# Patient Record
Sex: Female | Born: 1965 | Race: White | Hispanic: No | Marital: Married | Smoking: Current every day smoker
Health system: Southern US, Community
[De-identification: ages and names within clinical notes are randomized; demographics above are authoritative.]

## PROBLEM LIST (undated history)

## (undated) DIAGNOSIS — G8929 Other chronic pain: Secondary | ICD-10-CM

## (undated) DIAGNOSIS — G35 Multiple sclerosis: Secondary | ICD-10-CM

## (undated) DIAGNOSIS — F419 Anxiety disorder, unspecified: Secondary | ICD-10-CM

## (undated) HISTORY — DX: Anxiety disorder, unspecified: F41.9

## (undated) HISTORY — DX: Other chronic pain: G89.29

## (undated) HISTORY — DX: Multiple sclerosis: G35

## (undated) HISTORY — PX: THYROIDECTOMY: SHX17

---

## 2020-12-27 ENCOUNTER — Ambulatory Visit: Payer: Self-pay | Admitting: Internal Medicine

## 2021-01-11 ENCOUNTER — Ambulatory Visit: Payer: Medicaid Other | Admitting: Internal Medicine

## 2021-02-08 ENCOUNTER — Other Ambulatory Visit: Payer: Self-pay

## 2021-02-08 ENCOUNTER — Encounter: Payer: Self-pay | Admitting: Internal Medicine

## 2021-02-08 ENCOUNTER — Ambulatory Visit (INDEPENDENT_AMBULATORY_CARE_PROVIDER_SITE_OTHER): Payer: Medicare Other | Admitting: Internal Medicine

## 2021-02-08 VITALS — BP 128/82 | HR 102 | Resp 18 | Ht 67.0 in | Wt 177.1 lb

## 2021-02-08 DIAGNOSIS — Z1159 Encounter for screening for other viral diseases: Secondary | ICD-10-CM

## 2021-02-08 DIAGNOSIS — Z7689 Persons encountering health services in other specified circumstances: Secondary | ICD-10-CM | POA: Diagnosis not present

## 2021-02-08 DIAGNOSIS — Z1231 Encounter for screening mammogram for malignant neoplasm of breast: Secondary | ICD-10-CM

## 2021-02-08 DIAGNOSIS — G894 Chronic pain syndrome: Secondary | ICD-10-CM | POA: Insufficient documentation

## 2021-02-08 DIAGNOSIS — Z124 Encounter for screening for malignant neoplasm of cervix: Secondary | ICD-10-CM

## 2021-02-08 DIAGNOSIS — E890 Postprocedural hypothyroidism: Secondary | ICD-10-CM | POA: Diagnosis not present

## 2021-02-08 DIAGNOSIS — G35 Multiple sclerosis: Secondary | ICD-10-CM | POA: Diagnosis not present

## 2021-02-08 DIAGNOSIS — Z114 Encounter for screening for human immunodeficiency virus [HIV]: Secondary | ICD-10-CM

## 2021-02-08 DIAGNOSIS — G4700 Insomnia, unspecified: Secondary | ICD-10-CM

## 2021-02-08 MED ORDER — HYDROXYZINE PAMOATE 25 MG PO CAPS: 25.0000 mg | ORAL_CAPSULE | Freq: Three times a day (TID) | ORAL | 0 refills | Status: AC | PRN

## 2021-02-08 NOTE — Patient Instructions (Signed)
You are being referred to Dr Gerilyn Pilgrim for MS and chronic pain.  Please get fasting blood tests done within a week.

## 2021-02-09 ENCOUNTER — Encounter: Payer: Self-pay | Admitting: Internal Medicine

## 2021-02-09 NOTE — Progress Notes (Signed)
New Patient Office Visit  Subjective:  Patient ID: Lindsay Warren, female    DOB: 1966-01-03  Age: 55 y.o. MRN: [id number]  CC:  Chief Complaint  Patient presents with  . New Patient (Initial Visit)    New patient has MS and chronic pain back and legs are giving her a fit also tingling in legs is terrible also pt cant sleep she needs something to help her sleep pt also has ringing in her ears has been going on for about 4 months is constant     HPI Lindsay Warren is a 55 year old female with PMH of progressive MS, hypothyroidism, anxiety, insomnia and chronic pain syndrome who presents for establishing care. She recently moved from [place].  She has h/o MS, for which she used to see a Garment/textile technologist. She was on [place], but ran out of it recently. She has been having worsening of her weakness and pain as she also ran out her pain medications. She still takes Gabapentin and Lyrica (?). She was on Oxycodone 30 mg TID for pain as well.  She has h/o postoperative hypothyroidism, for which she was taking Levothyroxine, prescribed by her Neurologist (?). She does not know her dose of Levothyroxine. She denies any palpitations, tremors, recent change in appetite or weight.  She reports worsening of her anxiety and insomnia, as she has not had her Valium for more than 6 months. She denies any SI or HI.  She has had colonoscopy in the past, but does not recall any details.   Past Medical History:  Diagnosis Date  . Anxiety   . Chronic pain   . Multiple sclerosis ([place])     Past Surgical History:  Procedure Laterality Date  . THYROIDECTOMY      History reviewed. No pertinent family history.  Social History   Socioeconomic History  . Marital status: Married    Spouse name: Not on file  . Number of children: Not on file  . Years of education: Not on file  . Highest education level: Not on file  Occupational History  . Not on file  Tobacco Use  . Smoking status: Current  Every Day Smoker    Packs/day: 1.00    Types: Cigarettes  . Smokeless tobacco: Never Used  Substance and Sexual Activity  . Alcohol use: Never  . Drug use: Never  . Sexual activity: Not on file  Other Topics Concern  . Not on file  Social History Narrative  . Not on file   Social Determinants of Health   Financial Resource Strain: Not on file  Food Insecurity: Not on file  Transportation Needs: Not on file  Physical Activity: Not on file  Stress: Not on file  Social Connections: Not on file  Intimate Partner Violence: Not on file    ROS Review of Systems  Constitutional: Positive for fatigue. Negative for chills and fever.  HENT: Negative for congestion, sinus pressure, sinus pain and sore throat.   Eyes: Negative for pain and discharge.  Respiratory: Negative for cough and shortness of breath.   Cardiovascular: Negative for chest pain and palpitations.  Gastrointestinal: Negative for abdominal pain, constipation, diarrhea, nausea and vomiting.  Endocrine: Negative for polydipsia and polyuria.  Genitourinary: Negative for dysuria and hematuria.  Musculoskeletal: Positive for arthralgias, back pain, gait problem and neck pain. Negative for neck stiffness.  Skin: Negative for rash.  Neurological: Positive for weakness and numbness. Negative for dizziness and seizures.  Psychiatric/Behavioral: Positive for sleep disturbance. Negative  for agitation and behavioral problems. The patient is nervous/anxious.     Objective:   Today's Vitals: BP 128/82 (BP Location: Left Arm, Patient Position: Sitting, Cuff Size: Normal)   Pulse (!) 102   Resp 18   Ht 5' 7"  (1.702 m)   Wt 177 lb 1.9 oz (80.3 kg)   SpO2 98%   BMI 27.74 kg/m   Physical Exam Vitals reviewed.  Constitutional:      General: She is not in acute distress.    Appearance: She is not diaphoretic.  HENT:     Head: Normocephalic and atraumatic.     Nose: Nose normal.     Mouth/Throat:     Mouth: Mucous membranes  are moist.  Eyes:     General: No scleral icterus.    Extraocular Movements: Extraocular movements intact.  Cardiovascular:     Rate and Rhythm: Normal rate and regular rhythm.     Pulses: Normal pulses.     Heart sounds: Normal heart sounds. No murmur heard.   Pulmonary:     Breath sounds: Normal breath sounds. No wheezing or rales.  Abdominal:     Palpations: Abdomen is soft.     Tenderness: There is no abdominal tenderness.  Musculoskeletal:     Cervical back: Neck supple. No tenderness.     Right lower leg: No edema.     Left lower leg: No edema.  Skin:    General: Skin is warm.     Findings: No rash.  Neurological:     Mental Status: She is alert and oriented to person, place, and time. Mental status is at baseline.     Sensory: No sensory deficit.  Psychiatric:        Mood and Affect: Mood normal.        Behavior: Behavior normal.     Assessment & Plan:   Problem List Items Addressed This Visit      Encounter to establish care - Primary   Care established Previous chart reviewed History and medications reviewed with the patient     Relevant Orders  CBC with Differential  CMP14+EGFR  Lipid panel    Endocrine   Postoperative hypothyroidism    Has run out of Levothyroxine, does not recall dose Will request records from previous PCP Check TSH and free T4      Relevant Orders   TSH + free T4     Nervous and Auditory   Multiple sclerosis ([place])    Was on [place] in [place] Referred to Neurology for further management      Relevant Orders   Ambulatory referral to Neurology     Other      Insomnia    Was on Valium in the past, has not had it for more than 6 months Started Vistaril 25 mg TID PRN for anxiety and insomnia      Relevant Medications   hydrOXYzine (VISTARIL) 25 MG capsule   Chronic pain syndrome    On Lyrica 150 mg BID and Gabapentin 800 mg TID Was on Oxycodone 30 mg TID, has run out of it. Referred to Neurology for urgent evaluation  for MS and chronic pain syndrome      Relevant Medications   gabapentin (NEURONTIN) 800 MG tablet   pregabalin (LYRICA) 150 MG capsule   Other Relevant Orders   Ambulatory referral to Neurology    Other Visit Diagnoses    Screening mammogram for breast cancer       Relevant Orders  MM 3D SCREEN BREAST BILATERAL   Routine cervical smear       Relevant Orders   Ambulatory referral to Obstetrics / Gynecology   Need for hepatitis C screening test       Relevant Orders   Hepatitis C Antibody   Encounter for screening for HIV       Relevant Orders   HIV antibody (with reflex)      Outpatient Encounter Medications as of 02/08/2021  Medication Sig  . gabapentin (NEURONTIN) 800 MG tablet Take 800 mg by mouth 3 (three) times daily.  . hydrOXYzine (VISTARIL) 25 MG capsule Take 1 capsule (25 mg total) by mouth every 8 (eight) hours as needed.  . pregabalin (LYRICA) 150 MG capsule Take 150 mg by mouth 2 (two) times daily.   No facility-administered encounter medications on file as of 02/08/2021.    Follow-up: Return in about 3 months (around 05/11/2021) for Hypothyroidism.   Lindell Spar, MD

## 2021-02-09 NOTE — Assessment & Plan Note (Signed)
Care established Previous chart reviewed History and medications reviewed with the patient 

## 2021-02-09 NOTE — Assessment & Plan Note (Signed)
Was on Kesim[place] in [facility] Referred to Neurology for further management

## 2021-02-09 NOTE — Assessment & Plan Note (Signed)
Was on Valium in the past, has not had it for more than 6 months Started Vistaril 25 mg TID PRN for anxiety and insomnia

## 2021-02-09 NOTE — Assessment & Plan Note (Signed)
On Lyrica 150 mg BID and Gabapentin 800 mg TID Was on Oxycodone 30 mg TID, has run out of it. Referred to Neurology for urgent evaluation for MS and chronic pain syndrome

## 2021-02-09 NOTE — Assessment & Plan Note (Signed)
Has run out of Levothyroxine, does not recall dose Will request records from previous PCP Check TSH and free T4

## 2021-02-10 ENCOUNTER — Other Ambulatory Visit: Payer: Self-pay | Admitting: Internal Medicine

## 2021-02-10 DIAGNOSIS — E782 Mixed hyperlipidemia: Secondary | ICD-10-CM

## 2021-02-10 DIAGNOSIS — E890 Postprocedural hypothyroidism: Secondary | ICD-10-CM

## 2021-02-10 MED ORDER — LEVOTHYROXINE SODIUM 100 MCG PO TABS
100.0000 ug | ORAL_TABLET | Freq: Every day | ORAL | 0 refills | Status: DC
Start: 2021-02-10 — End: 2021-05-09

## 2021-02-10 MED ORDER — ROSUVASTATIN CALCIUM 10 MG PO TABS: 10.0000 mg | ORAL_TABLET | Freq: Every day | ORAL | 1 refills | Status: AC

## 2021-02-11 LAB — CMP14+EGFR
ALT: 21 IU/L (ref 0–32)
AST: 26 IU/L (ref 0–40)
Albumin/Globulin Ratio: 1.8 (ref 1.2–2.2)
Albumin: 5 g/dL — ABNORMAL HIGH (ref 3.8–4.9)
Alkaline Phosphatase: 73 IU/L (ref 44–121)
BUN/Creatinine Ratio: 14 (ref 9–23)
BUN: 15 mg/dL (ref 6–24)
Bilirubin Total: 0.3 mg/dL (ref 0.0–1.2)
CO2: 22 mmol/L (ref 20–29)
Calcium: 9.7 mg/dL (ref 8.7–10.2)
Chloride: 99 mmol/L (ref 96–106)
Creatinine, Ser: 1.11 mg/dL — ABNORMAL HIGH (ref 0.57–1.00)
Globulin, Total: 2.8 g/dL (ref 1.5–4.5)
Glucose: 92 mg/dL (ref 65–99)
Potassium: 4.7 mmol/L (ref 3.5–5.2)
Sodium: 139 mmol/L (ref 134–144)
Total Protein: 7.8 g/dL (ref 6.0–8.5)
eGFR: 59 mL/min/{1.73_m2} — ABNORMAL LOW (ref >59)

## 2021-02-11 LAB — CBC WITH DIFFERENTIAL/PLATELET
Basophils Absolute: 0.1 10*3/uL (ref 0.0–0.2)
Basos: 1 %
EOS (ABSOLUTE): 0.2 10*3/uL (ref 0.0–0.4)
Eos: 2 %
Hematocrit: 43.7 % (ref 34.0–46.6)
Hemoglobin: 14.1 g/dL (ref 11.1–15.9)
Immature Grans (Abs): 0 10*3/uL (ref 0.0–0.1)
Immature Granulocytes: 0 %
Lymphocytes Absolute: 2.8 10*3/uL (ref 0.7–3.1)
Lymphs: 31 %
MCH: 30.7 pg (ref 26.6–33.0)
MCHC: 32.3 g/dL (ref 31.5–35.7)
MCV: 95 fL (ref 79–97)
Monocytes Absolute: 0.6 10*3/uL (ref 0.1–0.9)
Monocytes: 6 %
Neutrophils Absolute: 5.6 10*3/uL (ref 1.4–7.0)
Neutrophils: 60 %
Platelets: 250 10*3/uL (ref 150–450)
RBC: 4.59 x10E6/uL (ref 3.77–5.28)
RDW: 14.7 % (ref 11.7–15.4)
WBC: 9.3 10*3/uL (ref 3.4–10.8)

## 2021-02-11 LAB — LIPID PANEL
Chol/HDL Ratio: 16.3 ratio — ABNORMAL HIGH (ref 0.0–4.4)
Cholesterol, Total: 488 mg/dL — ABNORMAL HIGH (ref 100–199)
HDL: 30 mg/dL — ABNORMAL LOW (ref >39)
LDL Chol Calc (NIH): 349 mg/dL — ABNORMAL HIGH (ref 0–99)
Triglycerides: 411 mg/dL — ABNORMAL HIGH (ref 0–149)
VLDL Cholesterol Cal: 109 mg/dL — ABNORMAL HIGH (ref 5–40)

## 2021-02-11 LAB — HEPATITIS C ANTIBODY: Hep C Virus Ab: <0.1 s/co ratio (ref 0.0–0.9)

## 2021-02-11 LAB — HIV ANTIBODY (ROUTINE TESTING W REFLEX): HIV Screen 4th Generation wRfx: NONREACTIVE

## 2021-02-11 LAB — TSH+FREE T4
Free T4: 0.1 ng/dL — ABNORMAL LOW (ref 0.82–1.77)
TSH: 166 u[IU]/mL — ABNORMAL HIGH (ref 0.450–4.500)

## 2021-02-14 ENCOUNTER — Encounter (HOSPITAL_COMMUNITY): Payer: Self-pay | Admitting: Radiology

## 2021-02-14 ENCOUNTER — Ambulatory Visit (HOSPITAL_COMMUNITY)
Admission: RE | Admit: 2021-02-14 | Discharge: 2021-02-14 | Disposition: A | Discharge status: Home / Self Care | Payer: Medicare Other | Source: Ambulatory Visit | Attending: Internal Medicine | Admitting: Internal Medicine

## 2021-02-14 DIAGNOSIS — Z1231 Encounter for screening mammogram for malignant neoplasm of breast: Secondary | ICD-10-CM | POA: Insufficient documentation

## 2021-02-24 ENCOUNTER — Encounter: Payer: Medicare Other | Admitting: Obstetrics & Gynecology

## 2021-04-15 ENCOUNTER — Encounter: Payer: Medicare Other | Admitting: Obstetrics & Gynecology

## 2021-05-08 ENCOUNTER — Other Ambulatory Visit: Payer: Self-pay | Admitting: Internal Medicine

## 2021-05-08 DIAGNOSIS — E890 Postprocedural hypothyroidism: Secondary | ICD-10-CM

## 2021-05-11 ENCOUNTER — Ambulatory Visit: Payer: Medicare Other | Admitting: Internal Medicine

## 2022-03-11 ENCOUNTER — Other Ambulatory Visit: Payer: Self-pay | Admitting: Internal Medicine

## 2022-03-11 DIAGNOSIS — E782 Mixed hyperlipidemia: Secondary | ICD-10-CM

## 2022-05-13 IMAGING — MG MM DIGITAL SCREENING BILAT W/ TOMO AND CAD
8 series · 9 of 24 positions shown · non-contrast
Comparison: None.

CLINICAL DATA: Screening. Baseline.

EXAM:
DIGITAL SCREENING BILATERAL MAMMOGRAM WITH TOMOSYNTHESIS AND CAD
TECHNIQUE: Bilateral screening digital craniocaudal and mediolateral oblique
mammograms were obtained. Bilateral screening digital breast
tomosynthesis was performed. The images were evaluated with
computer-aided detection.

[R MLO synth-2D]
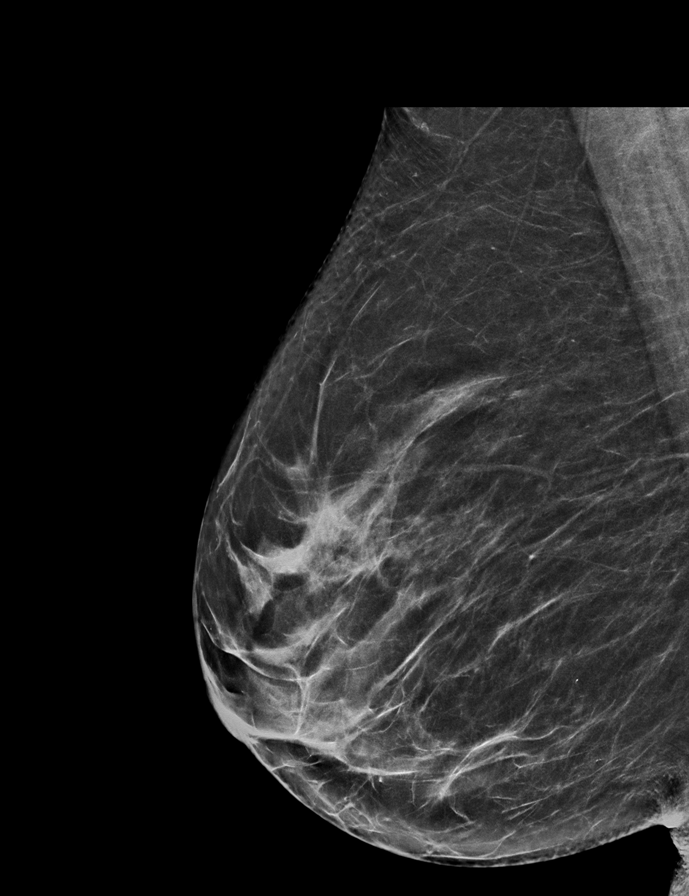

[R CC synth-2D]
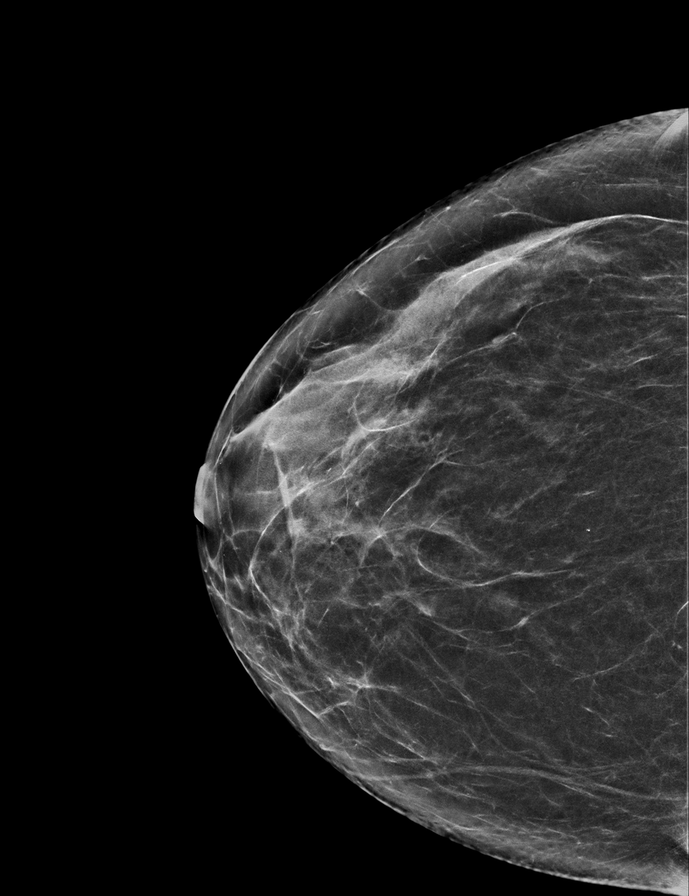

[L MLO synth-2D]
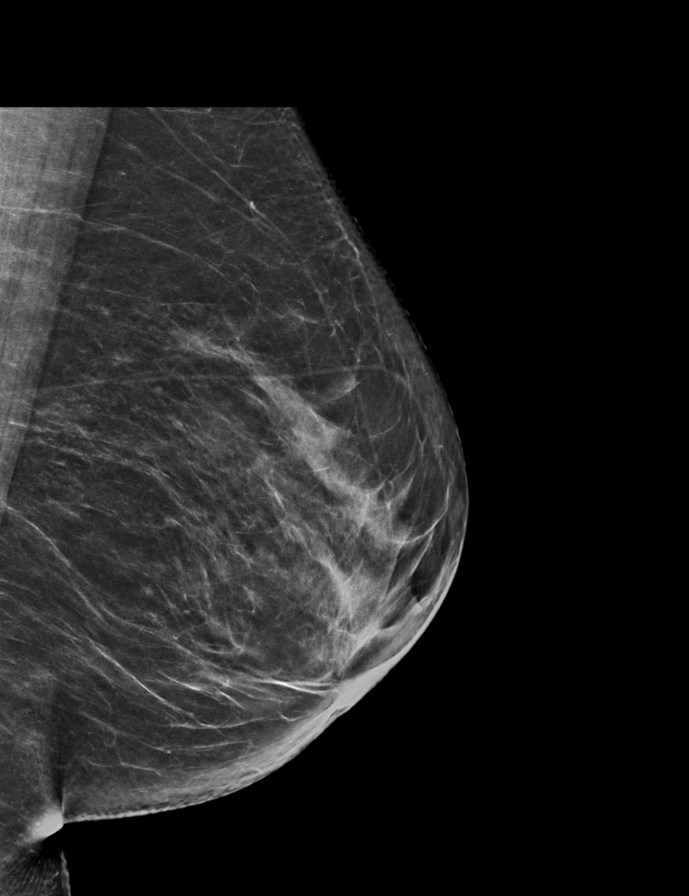

[L CC synth-2D]
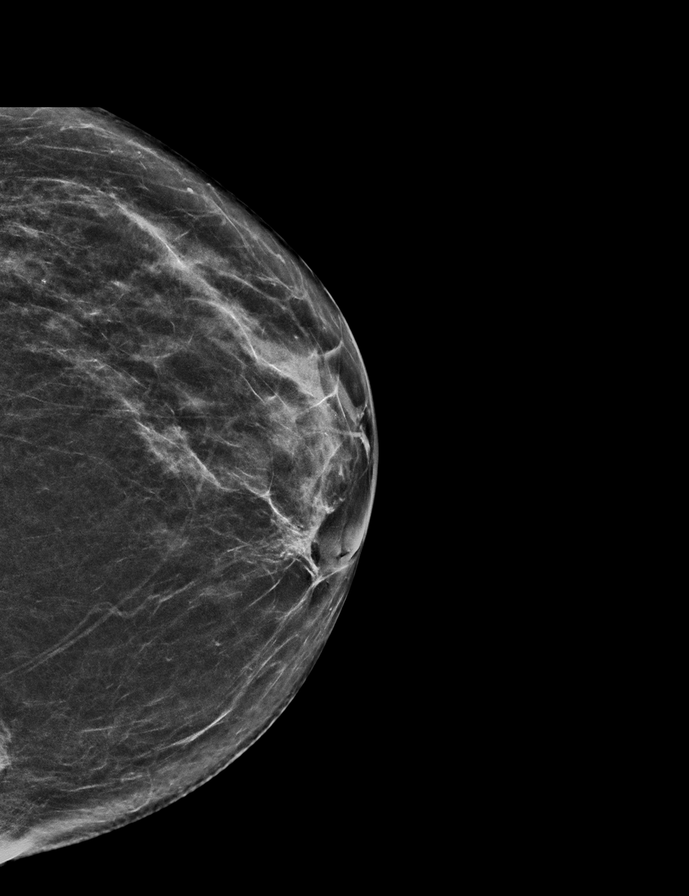

[R MLO tomo · 2 of 67 frames shown]
[frame 22/67]
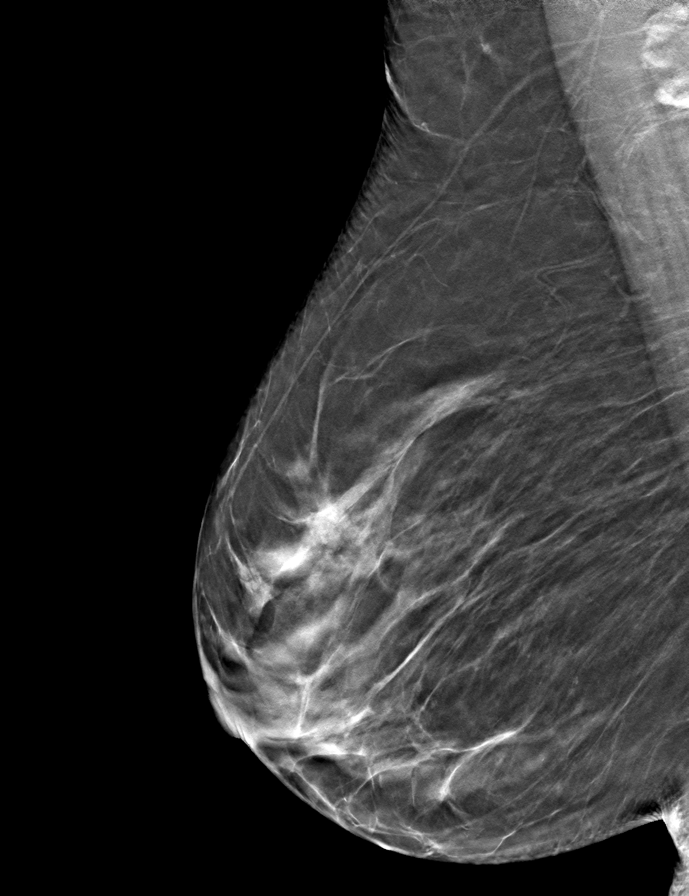
[frame 34/67]
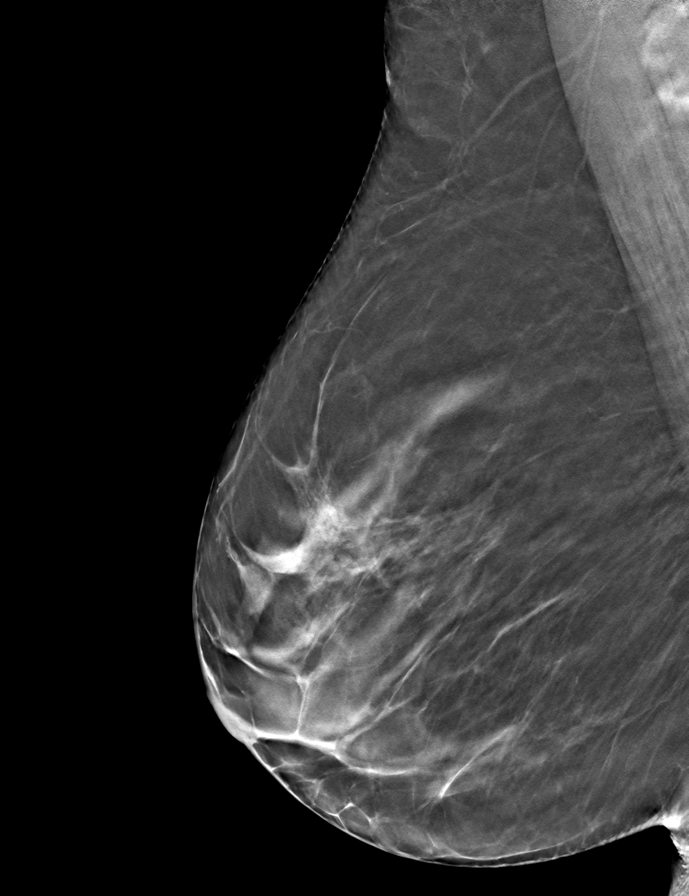

[L CC tomo · tomo slice 29/57.0]
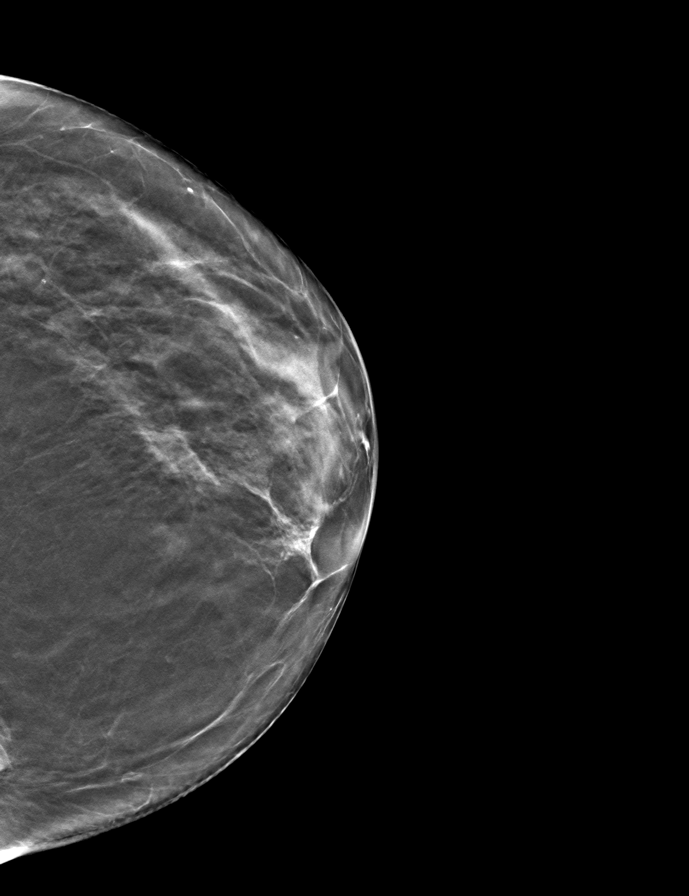

[R CC tomo · tomo slice 31/62.0]
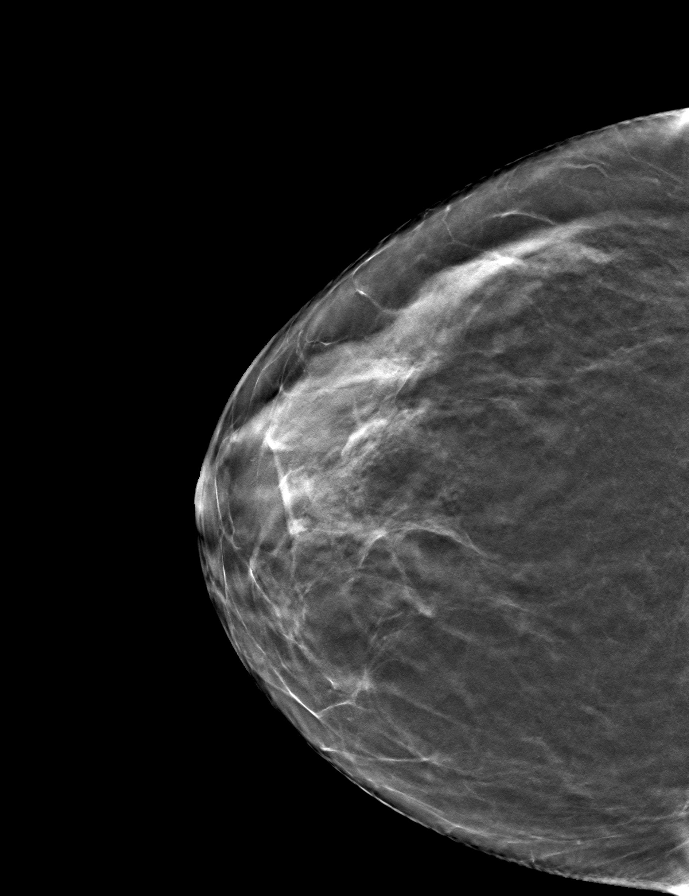

[L MLO tomo · tomo slice 33/66.0]
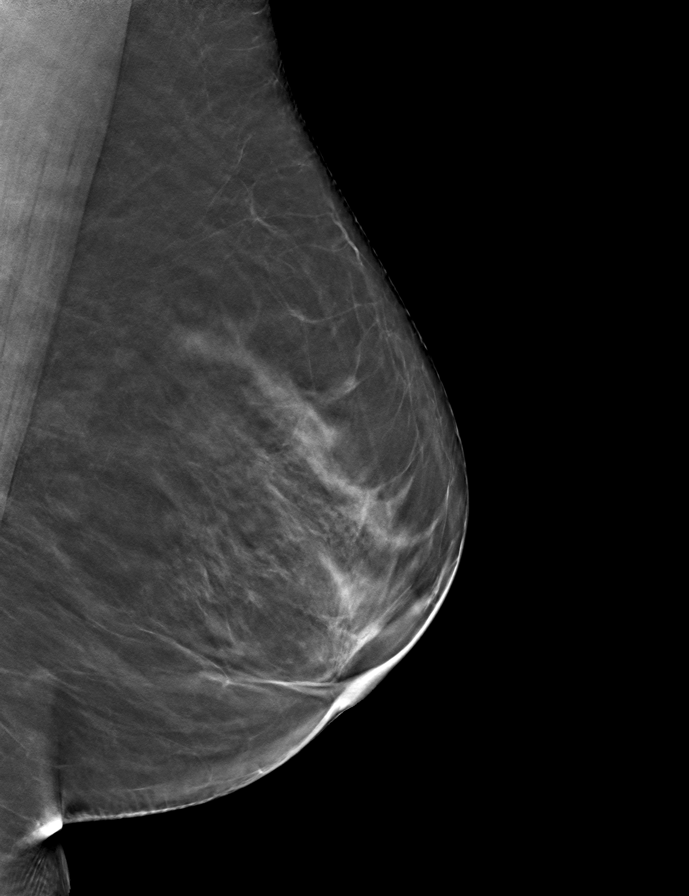

[9 of 24 positions shown; findings below may reference images not displayed]

ACR Breast Density Category b: There are scattered areas of
fibroglandular density.
FINDINGS: There are no findings suspicious for malignancy. The images were
evaluated with computer-aided detection.
IMPRESSION: No mammographic evidence of malignancy. A result letter of this
screening mammogram will be mailed directly to the patient.

RECOMMENDATION:
Screening mammogram in one year. (Code:U6-F-9FH)

BI-RADS CATEGORY  1: Negative.
